# Patient Record
Sex: Male | Born: 2000 | Race: White | Hispanic: Yes | Marital: Single | Smoking: Never smoker
Health system: Southern US, Community
[De-identification: ages and names within clinical notes are randomized; demographics above are authoritative.]

## PROBLEM LIST (undated history)

## (undated) DIAGNOSIS — Z789 Other specified health status: Secondary | ICD-10-CM

---
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
## 2024-02-18 ENCOUNTER — Ambulatory Visit (HOSPITAL_COMMUNITY)
Admission: EM | Admit: 2024-02-18 | Discharge: 2024-02-18 | Disposition: A | Discharge status: Home / Self Care | Payer: Self-pay

## 2024-02-18 ENCOUNTER — Encounter (HOSPITAL_COMMUNITY): Payer: Self-pay

## 2024-02-18 DIAGNOSIS — L03211 Cellulitis of face: Secondary | ICD-10-CM

## 2024-02-18 MED ORDER — NAPROXEN 500 MG PO TABS: 500.0000 mg | ORAL_TABLET | Freq: Two times a day (BID) | ORAL | 0 refills | Status: AC

## 2024-02-18 MED ORDER — AMOXICILLIN-POT CLAVULANATE 875-125 MG PO TABS: 1.0000 | ORAL_TABLET | Freq: Two times a day (BID) | ORAL | 0 refills | Status: DC

## 2024-02-18 NOTE — ED Triage Notes (Signed)
 Patient presenting with mass behind the left ear onset 1 week ago. States this is the first time. Slightly painful now and growing.  Denies any drainage  Prescriptions or OTC medications tried: Yes- tylenol    with little relief

## 2024-02-18 NOTE — ED Provider Notes (Signed)
 [facility]  Note:  This document was prepared using Dragon voice recognition software and may include unintentional dictation errors.  MRN: [id number] DOB: June 04, 2001  Subjective:   Gregg Burns is a 23 y.o. male presenting for swelling to posterior left ear x 1 week.  Patient admits to taking Tylenol  for pain and inflammation with minimal improvement.  Denies any injury or known cause.  No inner ear pain, ear drainage, headache, fever, decreased hearing, or fever.  Patient denies any prior history of abscesses or cysts.  No current facility-administered medications for this encounter.  Current Outpatient Medications:    amoxicillin -clavulanate (AUGMENTIN ) 875-125 MG tablet, Take 1 tablet by mouth every 12 (twelve) hours., Disp: 14 tablet, Rfl: 0   MULTIPLE VITAMINS PO, Take by mouth., Disp: , Rfl:    naproxen  (NAPROSYN ) 500 MG tablet, Take 1 tablet (500 mg total) by mouth 2 (two) times daily., Disp: 30 tablet, Rfl: 0   No Known Allergies  Past Medical History:  Diagnosis Date   Medical history non-contributory      History reviewed. No pertinent surgical history.  History reviewed. No pertinent family history.  Social History   Tobacco Use   Smoking status: Never   Smokeless tobacco: Never  Vaping Use   Vaping status: Never Used  Substance Use Topics   Alcohol use: No   Drug use: No    ROS Refer to HPI for ROS details.  Objective:   Vitals: BP 109/71 (BP Location: Right Arm)   Pulse 74   Temp 98.5 F (36.9 C) (Oral)   Resp 16   Ht 5' 11 (1.803 m)   Wt 180 lb (81.6 kg)   SpO2 96%   BMI 25.10 kg/m   Physical Exam Vitals and nursing note reviewed.  Constitutional:      General: Gregg Burns is not in acute distress.    Appearance: Normal appearance. Gregg Burns is well-developed. Gregg Burns is not ill-appearing or toxic-appearing.  HENT:     Head: Normocephalic and atraumatic.     Right Ear: Tympanic membrane, ear canal and external ear normal. No  swelling or tenderness. No mastoid tenderness. Tympanic membrane is not injected, erythematous or bulging.     Left Ear: Tympanic membrane and ear canal normal. Swelling and tenderness present. No mastoid tenderness. Tympanic membrane is not injected, erythematous or bulging.     Nose: Nose normal. No congestion.     Mouth/Throat:     Mouth: Mucous membranes are moist.     Pharynx: Oropharynx is clear.  Cardiovascular:     Rate and Rhythm: Normal rate.  Pulmonary:     Effort: Pulmonary effort is normal. No respiratory distress.  Skin:    General: Skin is warm and dry.  Neurological:     General: No focal deficit present.     Mental Status: Gregg Burns is alert and oriented to person, place, and time.  Psychiatric:        Mood and Affect: Mood normal.        Behavior: Behavior normal.     Procedures  No results found for this or any previous visit (from the past 24 hours).  No results found.   Assessment and Plan :     Discharge Instructions       1. Preauricular cellulitis (Primary) - amoxicillin -clavulanate (AUGMENTIN ) 875-125 MG tablet; Take 1 tablet by mouth every 12 (twelve) hours.  Dispense: 14 tablet; Refill: 0 - naproxen  (NAPROSYN ) 500 MG tablet; Take 1 tablet (500 mg total) by mouth  2 (two) times daily.  Dispense: 30 tablet; Refill: 0 - Continue to monitor symptoms for any change severity if there is any escalation of current symptoms or development of new symptoms follow-up for further evaluation and management.      Gregg Burns   Gregg Burns, Gregg Burns, Gregg Burns 02/18/24 [phone]

## 2024-02-18 NOTE — Discharge Instructions (Signed)
  1. Preauricular cellulitis (Primary) - amoxicillin -clavulanate (AUGMENTIN ) 875-125 MG tablet; Take 1 tablet by mouth every 12 (twelve) hours.  Dispense: 14 tablet; Refill: 0 - naproxen  (NAPROSYN ) 500 MG tablet; Take 1 tablet (500 mg total) by mouth 2 (two) times daily.  Dispense: 30 tablet; Refill: 0 - Continue to monitor symptoms for any change severity if there is any escalation of current symptoms or development of new symptoms follow-up for further evaluation and management.

## 2024-02-20 ENCOUNTER — Encounter: Payer: Self-pay | Admitting: Internal Medicine

## 2024-02-20 ENCOUNTER — Ambulatory Visit (INDEPENDENT_AMBULATORY_CARE_PROVIDER_SITE_OTHER): Payer: Self-pay | Admitting: Internal Medicine

## 2024-02-20 VITALS — BP 109/75 | HR 60 | Ht 72.0 in | Wt 180.8 lb

## 2024-02-20 DIAGNOSIS — R590 Localized enlarged lymph nodes: Secondary | ICD-10-CM

## 2024-02-20 DIAGNOSIS — L03211 Cellulitis of face: Secondary | ICD-10-CM

## 2024-02-20 DIAGNOSIS — K1121 Acute sialoadenitis: Secondary | ICD-10-CM

## 2024-02-20 NOTE — Progress Notes (Addendum)
 New Patient Office Visit  Subjective:  Patient ID: Gregg Burns, male    DOB: 2000-12-03  Age: 23 y.o. MRN: [id number]  CC:  Chief Complaint  Patient presents with   Ear Pain    Pt reports ear pain and left side of face also some swelling. Had a spider bite 3 weeks ago on right side. Went to [facility] antibiotic was prescribed.     HPI Gregg Burns is a 23 y.o. male with past medical history of MDD who presents for establishing care.  He reports having pain around left ear and bumps in the neck area.  He has noticed hard and tender mass anterior to the left ear for the last 1 week.  He has noticed ear pain and pain over the left side of the face, went to urgent care 2 days ago.  He was given Augmentin  for preauricular cellulitis.  He has also noticed tender bumps in the submandibular area and supraclavicular area.  Has noticed low-grade fever, but denies chills.  He is not sexually active, but has had kissing exposure.  Has mild fatigue, but denies any abdominal pain, nausea or vomiting.  He has had MMR vaccine during childhood.   Past Medical History:  Diagnosis Date   Medical history non-contributory     History reviewed. No pertinent surgical history.  History reviewed. No pertinent family history.  Social History   Socioeconomic History   Marital status: Single    Spouse name: Not on file   Number of children: Not on file   Years of education: Not on file   Highest education level: Not on file  Occupational History   Not on file  Tobacco Use   Smoking status: Never   Smokeless tobacco: Never  Vaping Use   Vaping status: Never Used  Substance and Sexual Activity   Alcohol use: No   Drug use: No   Sexual activity: Never    Birth control/protection: Abstinence  Other Topics Concern   Not on file  Social History Narrative   Not on file   Social Drivers of Health   Financial Resource Strain: Not on file  Food Insecurity: Not on file   Transportation Needs: Not on file  Physical Activity: Not on file  Stress: Not on file  Social Connections: Not on file  Intimate Partner Violence: Not on file    ROS Review of Systems  Constitutional:  Positive for fatigue and fever. Negative for chills.  HENT:  Positive for ear pain (Left). Negative for congestion, ear discharge and sore throat.   Eyes:  Negative for pain and discharge.  Respiratory:  Negative for cough and shortness of breath.   Cardiovascular:  Negative for chest pain and palpitations.  Gastrointestinal:  Negative for diarrhea, nausea and vomiting.  Endocrine: Negative for polydipsia and polyuria.  Genitourinary:  Negative for dysuria and hematuria.  Musculoskeletal:  Negative for neck pain and neck stiffness.  Skin:  Negative for rash.  Neurological:  Negative for dizziness and weakness.  Psychiatric/Behavioral:  Negative for agitation and behavioral problems.     Objective:   Today's Vitals: BP 109/75   Pulse 60   Ht 6' (1.829 m)   Wt 180 lb 12.8 oz (82 kg)   SpO2 94%   BMI 24.52 kg/m   Physical Exam Vitals reviewed.  Constitutional:      General: He is not in acute distress.    Appearance: He is not diaphoretic.  HENT:  Head: Normocephalic and atraumatic.     Right Ear: Ear canal normal.     Left Ear: Ear canal normal.     Nose: Nose normal.     Mouth/Throat:     Mouth: Mucous membranes are moist.  Eyes:     General: No scleral icterus.    Extraocular Movements: Extraocular movements intact.  Neck:   Cardiovascular:     Rate and Rhythm: Normal rate and regular rhythm.     Heart sounds: Normal heart sounds. No murmur heard. Pulmonary:     Breath sounds: Normal breath sounds. No wheezing or rales.  Musculoskeletal:     Cervical back: Neck supple. No tenderness.     Right lower leg: No edema.     Left lower leg: No edema.  Lymphadenopathy:     Cervical: Cervical adenopathy present.     Right cervical: Posterior cervical adenopathy  present.  Skin:    General: Skin is warm.     Findings: No rash.  Neurological:     General: No focal deficit present.     Mental Status: He is alert and oriented to person, place, and time.  Psychiatric:        Mood and Affect: Mood normal.        Behavior: Behavior normal.     Assessment & Plan:   Problem List Items Addressed This Visit       Digestive   Parotitis, acute - Primary   Likely has acute parotitis in addition to cervical LAD Most likely related to viral infection Advised to maintain adequate hydration Advised to perform warm compresses and/or milking of the duct Can complete course of Augmentin  for bacterial prophylaxis If persistent, will need ENT evaluation        Immune and Lymphatic   LAD (lymphadenopathy) of left cervical region   Differential includes viral infection, such as infectious mononucleosis Does not have splenomegaly currently Requires conservative treatment, avoided additional testing due to financial constraints If persistent concern, will get imaging and/or ENT evaluation       Addendum: Due to persistent swelling, referred to ENT.  Outpatient Encounter Medications as of 02/20/2024  Medication Sig   amoxicillin -clavulanate (AUGMENTIN ) 875-125 MG tablet Take 1 tablet by mouth every 12 (twelve) hours.   MULTIPLE VITAMINS PO Take by mouth.   naproxen  (NAPROSYN ) 500 MG tablet Take 1 tablet (500 mg total) by mouth 2 (two) times daily.   [DISCONTINUED] cetirizine  (ZYRTEC ) 10 MG tablet Take 1 tablet (10 mg total) by mouth daily. (Patient not taking: Reported on 04/21/2016)   [DISCONTINUED] FLUoxetine  (PROZAC ) 20 MG capsule Take 1 capsule (20 mg total) by mouth daily. (Patient not taking: Reported on 09/22/2017)   No facility-administered encounter medications on file as of 02/20/2024.    Follow-up: Return if symptoms worsen or fail to improve.   Suzzane MARLA Blanch, MD

## 2024-02-20 NOTE — Patient Instructions (Addendum)
 Please complete course of Augmentin  as prescribed.  Please perform warm compresses as discussed.

## 2024-02-20 NOTE — Assessment & Plan Note (Addendum)
 Likely has acute parotitis in addition to cervical LAD Most likely related to viral infection Advised to maintain adequate hydration Advised to perform warm compresses and/or milking of the duct Can complete course of Augmentin  for bacterial prophylaxis If persistent, will need ENT evaluation

## 2024-02-20 NOTE — Assessment & Plan Note (Signed)
 Differential includes viral infection, such as infectious mononucleosis Does not have splenomegaly currently Requires conservative treatment, avoided additional testing due to financial constraints If persistent concern, will get imaging and/or ENT evaluation

## 2024-03-07 MED ORDER — AMOXICILLIN-POT CLAVULANATE 875-125 MG PO TABS: 1.0000 | ORAL_TABLET | Freq: Two times a day (BID) | ORAL | 0 refills | Status: DC

## 2024-03-07 NOTE — Addendum Note (Signed)
 Addended byBETHA TOBIE DOWNS on: 03/07/2024 11:55 AM   Modules accepted: Orders

## 2024-03-25 ENCOUNTER — Ambulatory Visit (INDEPENDENT_AMBULATORY_CARE_PROVIDER_SITE_OTHER): Payer: Self-pay | Admitting: Otolaryngology

## 2024-03-25 ENCOUNTER — Encounter (INDEPENDENT_AMBULATORY_CARE_PROVIDER_SITE_OTHER): Payer: Self-pay | Admitting: Otolaryngology

## 2024-03-25 VITALS — BP 129/75 | HR 71 | Ht 72.0 in | Wt 180.0 lb

## 2024-03-25 DIAGNOSIS — K118 Other diseases of salivary glands: Secondary | ICD-10-CM

## 2024-03-25 DIAGNOSIS — K1120 Sialoadenitis, unspecified: Secondary | ICD-10-CM

## 2024-03-25 MED ORDER — AMOXICILLIN-POT CLAVULANATE 875-125 MG PO TABS: 1.0000 | ORAL_TABLET | Freq: Two times a day (BID) | ORAL | 0 refills | Status: AC

## 2024-03-25 NOTE — Progress Notes (Signed)
 Dear Dr. Tobie, Here is my assessment for our mutual patient, Gregg Burns. Thank you for allowing me the opportunity to care for your patient. Please do not hesitate to contact me should you have any other questions. Sincerely, Dr. Eldora Tobie  Otolaryngology Clinic Note Referring provider: Dr. Tobie HPI:  Gregg Burns is a 23 y.o. male kindly referred by Dr. Tobie for evaluation of left preauricular swelling  Initial visit (03/2024): Patient reports that about a month ago, patient started to have some left parotid area swelling. It was painful, and the swelling progressed to involved his cheek and went all the way up to his eye. He went to UC, was prescribed Augmentin  and did parotid massage which helped the symptoms. However, there continues to be a small mass postauricular/parotid tail area which persists. No pain, stays about the same. He has not tried anything else except facial massage which he continues to do. Up to date on vaccinations. He reports that the only other exposure was a spider bite on the RIGHT cheek about a month prior. No submandibular gland swelling or neck masses. No facial weakness or numbness. No testicular swelling or orchitis sx. No joint pain, rashes. No jaw pain, no dental pain. Does not see dentist regularly. No cat scratches but does live with cats Patient otherwise denies: - dysphagia, odynophagia, unintentional weight loss - ear pain  H&N Surgery: no Personal or FHx of bleeding dz or anesthesia difficulty: no  AP/AC: no  Tobacco: no  PMHx: Healthy, prior MDD  Independent Review of Additional Tests or Records:  Dr. JONELLE Tobie (03/2024): pain around left ear, with hard and tender mass anterior to left ear for 1 week; given augmentin  for preauricular cellulitis; Dx: Acute parotitis possibly viral; LAD; Rx: ref to ENT UC 02/18/2024 [name] - posterior left ear swelling, no fever, decreased hearing. Dx: preauricular  celllulitis; Rx: augmentin   PMH/Meds/All/SocHx/FamHx/ROS:   Past Medical History:  Diagnosis Date   Medical history non-contributory      History reviewed. No pertinent surgical history.  History reviewed. No pertinent family history.   Social Connections: Not on file      Current Outpatient Medications:    MULTIPLE VITAMINS PO, Take by mouth., Disp: , Rfl:    naproxen  (NAPROSYN ) 500 MG tablet, Take 1 tablet (500 mg total) by mouth 2 (two) times daily., Disp: 30 tablet, Rfl: 0   amoxicillin -clavulanate (AUGMENTIN ) 875-125 MG tablet, Take 1 tablet by mouth every 12 (twelve) hours., Disp: 14 tablet, Rfl: 0   Physical Exam:   BP 129/75 (BP Location: Right Arm, Patient Position: Sitting, Cuff Size: Normal)   Pulse 71   Ht 6' (1.829 m)   Wt 180 lb (81.6 kg)   SpO2 98%   BMI 24.41 kg/m   Salient findings:  CN II-XII intact - facial nerve function intact Bilateral EAC clear and TM intact with well pneumatized middle ear spaces Anterior rhinoscopy: Septum intact; bilateral inferior turbinates without significant hypertrophy No lesions of oral cavity/oropharynx; dentition fair; easily expressible saliva b/l parotid ducts No obviously palpable neck masses/lymphadenopathy/thyromegaly EXCEPT - left parotid nodule(?) v/s LN just anterior to the tragus as well as inferiorly/over parotid tail; not tender, no overlying skin changes; relatively soft and mobile No respiratory distress or stridor  Seprately Identifiable Procedures:  Prior to initiating any procedures, risks/benefits/alternatives were explained to the patient and verbal consent obtained. None  Impression & Plans:  Gregg Burns is a 23 y.o. male with  1. Parotid mass  2. Parotitis    We discussed reactive, benign and malignant etiologies; given sx, suspect intraparotid LN but given persistence, joint decision made to treat with another round of abx and get post-treatment CT  - Augmentin  BID x10d - Warm  compress  - Massage the area - f/u in 6 weeks with CT  See below regarding exact medications prescribed this encounter including dosages and route: Meds ordered this encounter  Medications   amoxicillin -clavulanate (AUGMENTIN ) 875-125 MG tablet    Sig: Take 1 tablet by mouth every 12 (twelve) hours.    Dispense:  14 tablet    Refill:  0      Thank you for allowing me the opportunity to care for your patient. Please do not hesitate to contact me should you have any other questions.  Sincerely, Eldora Blanch, MD Otolaryngologist (ENT), [facility] Phone: [phone] Fax: [phone]  03/25/2024, 9:26 AM   MDM:  Level 4 - [phone] Complexity/Problems addressed: mod Data complexity: mod - independent review of notes (multiple), and ordering test - Morbidity: mod  - Prescription Drug prescribed or managed: y

## 2024-03-25 NOTE — Patient Instructions (Signed)
 Take Augmentin  875 mg by mouth (PO) twice daily for 10 days; take with food, take probiotic or yogurt with it I have ordered an imaging study for you to complete prior to your next visit. Please call [facility] Scheduling at [phone] to schedule your imaging if you have not received a call within 24 hours. If you are unable to complete your imaging study prior to your next scheduled visit please call our office to let us  know.

## 2024-04-09 ENCOUNTER — Telehealth (INDEPENDENT_AMBULATORY_CARE_PROVIDER_SITE_OTHER): Payer: Self-pay | Admitting: Otolaryngology

## 2024-04-09 NOTE — Telephone Encounter (Signed)
 LVM for patient to call back to reschedule 10/06 appointment - Dr. Tobie in surgery

## 2024-04-29 ENCOUNTER — Ambulatory Visit (HOSPITAL_BASED_OUTPATIENT_CLINIC_OR_DEPARTMENT_OTHER)
Admission: RE | Admit: 2024-04-29 | Discharge: 2024-04-29 | Disposition: A | Discharge status: Home / Self Care | Payer: Self-pay | Source: Ambulatory Visit | Attending: Otolaryngology | Admitting: Otolaryngology

## 2024-04-29 DIAGNOSIS — K118 Other diseases of salivary glands: Secondary | ICD-10-CM

## 2024-04-29 MED ORDER — IOHEXOL 300 MG/ML  SOLN
100.0000 mL | Freq: Once | INTRAMUSCULAR | Status: AC | PRN
Start: 2024-04-29 — End: 2024-04-29
  Administered 2024-04-29: 76 mL via INTRAVENOUS

## 2024-05-06 ENCOUNTER — Ambulatory Visit (INDEPENDENT_AMBULATORY_CARE_PROVIDER_SITE_OTHER): Payer: Self-pay | Admitting: Otolaryngology

## 2024-05-06 ENCOUNTER — Encounter (INDEPENDENT_AMBULATORY_CARE_PROVIDER_SITE_OTHER): Payer: Self-pay | Admitting: Otolaryngology

## 2024-05-06 VITALS — BP 109/63 | HR 68 | Ht 72.0 in | Wt 180.0 lb

## 2024-05-06 DIAGNOSIS — K118 Other diseases of salivary glands: Secondary | ICD-10-CM

## 2024-05-06 NOTE — Patient Instructions (Addendum)
 I have ordered an imaging study for you to complete prior to your next visit. Please call [facility] Scheduling at [phone] to schedule your imaging if you have not received a call within 24 hours. If you are unable to complete your imaging study prior to your next scheduled visit please call our office to let us  know.    [email or web address]

## 2024-05-06 NOTE — Progress Notes (Signed)
 Dear Dr. Tobie, Here is my assessment for our mutual patient, Gregg Burns. Thank you for allowing me the opportunity to care for your patient. Please do not hesitate to contact me should you have any other questions. Sincerely, Dr. Eldora Tobie  Otolaryngology Clinic Note Referring provider: Dr. Tobie HPI:  Gregg Burns is a 23 y.o. male kindly referred by Dr. Tobie for evaluation of left preauricular swelling  Initial visit (03/2024): Patient reports that about a month ago, patient started to have some left parotid area swelling. It was painful, and the swelling progressed to involved his cheek and went all the way up to his eye. He went to UC, was prescribed Augmentin  and did parotid massage which helped the symptoms. However, there continues to be a small mass postauricular/parotid tail area which persists. No pain, stays about the same. He has not tried anything else except facial massage which he continues to do. Up to date on vaccinations. He reports that the only other exposure was a spider bite on the RIGHT cheek about a month prior. No submandibular gland swelling or neck masses. No facial weakness or numbness. No testicular swelling or orchitis sx. No joint pain, rashes. No jaw pain, no dental pain. Does not see dentist regularly. No cat scratches but does live with cats Patient otherwise denies: - dysphagia, odynophagia, unintentional weight loss - ear pain  --------------------------------------------------------- 05/06/2024 Vaccinated. Mild discomfort with significant pressure. No other lumps or bumps in face he has noted. We discussed his CT. No animal exposures otherwise. No facial weakness. No change after abx.  H&N Surgery: no Personal or FHx of bleeding dz or anesthesia difficulty: no  AP/AC: no  Tobacco: no  PMHx: Healthy, prior MDD  Independent Review of Additional Tests or Records:  Dr. JONELLE Tobie (03/2024): pain around left ear, with  hard and tender mass anterior to left ear for 1 week; given augmentin  for preauricular cellulitis; Dx: Acute parotitis possibly viral; LAD; Rx: ref to ENT UC 02/18/2024 [name] - posterior left ear swelling, no fever, decreased hearing. Dx: preauricular celllulitis; Rx: augmentin  CT Neck 04/29/2024 independently interpreted: multiple left masses over parotid gland, no noted obvious lesions on right. Also with some bilateral but L > R smaller lymph nodes. No obvious enhancing lesions noted PMH/Meds/All/SocHx/FamHx/ROS:   Past Medical History:  Diagnosis Date   Medical history non-contributory      History reviewed. No pertinent surgical history.  History reviewed. No pertinent family history.   Social Connections: Not on file      Current Outpatient Medications:    amoxicillin -clavulanate (AUGMENTIN ) 875-125 MG tablet, Take 1 tablet by mouth every 12 (twelve) hours., Disp: 14 tablet, Rfl: 0   MULTIPLE VITAMINS PO, Take by mouth., Disp: , Rfl:    naproxen  (NAPROSYN ) 500 MG tablet, Take 1 tablet (500 mg total) by mouth 2 (two) times daily., Disp: 30 tablet, Rfl: 0   Physical Exam:   BP 109/63 (BP Location: Right Arm, Patient Position: Sitting, Cuff Size: Large)   Pulse 68   Ht 6' (1.829 m)   Wt 180 lb (81.6 kg)   SpO2 95%   BMI 24.41 kg/m   Salient findings:  CN II-XII intact - facial nerve function intact Bilateral EAC clear and TM intact with well pneumatized middle ear spaces Anterior rhinoscopy: Septum intact; bilateral inferior turbinates without significant hypertrophy No lesions of oral cavity/oropharynx; dentition fair; easily expressible saliva b/l parotid ducts No obviously palpable neck masses/lymphadenopathy/thyromegaly EXCEPT - left parotid nodule(?) v/s  LN just anterior to the tragus as well as inferiorly/over parotid tail; not tender, no overlying skin changes; relatively soft and mobile No respiratory distress or stridor  Seprately Identifiable Procedures:   Prior to initiating any procedures, risks/benefits/alternatives were explained to the patient and verbal consent obtained. None  Impression & Plans:  Gregg Burns is a 23 y.o. male with  1. 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 We also discussed HIV but since this is unilateral deferred testing.  - FNA - f/u in 6 weeks by phone --- given info for financial assistance  See below regarding exact medications prescribed this encounter including dosages and route: No orders of the defined types were placed in this encounter.     Thank you for allowing me the opportunity to care for your patient. Please do not hesitate to contact me should you have any other questions.  Sincerely, Eldora Blanch, MD Otolaryngologist (ENT), [facility] Phone: [phone] Fax: [phone]  05/06/2024, 3:06 PM   MDM:  [phone] Complexity/Problems addressed: low Data complexity: mod - independent CT interpret - Morbidity: low - Prescription Drug prescribed or managed n

## 2024-05-07 ENCOUNTER — Encounter (HOSPITAL_COMMUNITY): Payer: Self-pay

## 2024-05-07 NOTE — Progress Notes (Signed)
 [name], MD  [name] PROCEDURE / BIOPSY REVIEW Date: 05/07/24  Requested Biopsy site: Left parotid mass and lymph node Reason for request: needs diagnosis Imaging review: CT neck  Decision: Approved Imaging modality to perform: Ultrasound Schedule with: ask patient if they want sedation. Schedule for: Any VIR  Additional comments: US  FNA of left parotid mass ordered.  Discussed with ENT about core biopsy to rule out lymphoma.  Would evaluate lymph nodes and left parotid with US .  If they all look like lymph nodes, core biopsy most accessible.  May also do FNA of largest parotid lesion.  Please contact me with questions, concerns, or if issue pertaining to this request arise.  [name] Philip, MD [facility]       Previous Messages    ----- Message ----- From: Gregg Burns Sent: 05/06/2024   3:08 PM EDT To: Gregg Burns; Ir Procedure Requests Subject: US  FNA BIOPSY SALIVARY GLAND PAROTID GLAND E*  Procedure : US  FNA BIOPSY SALIVARY GLAND PAROTID GLAND EA ADDT'L LESION  Reason : left parotid masses, request FNA Dx: Parotid mass [K11.8 (ICD-10-CM)]  Ordering Comments  left parotid masses, request FNA    History : CT soft tissue neck w/  Provider : Tobie Eldora [name], MD  Contact : [phone]

## 2024-06-04 ENCOUNTER — Ambulatory Visit (HOSPITAL_COMMUNITY)
Admission: RE | Admit: 2024-06-04 | Discharge: 2024-06-04 | Disposition: A | Discharge status: Home / Self Care | Payer: Self-pay | Source: Ambulatory Visit | Attending: Otolaryngology | Admitting: Otolaryngology

## 2024-06-04 DIAGNOSIS — K118 Other diseases of salivary glands: Secondary | ICD-10-CM | POA: Insufficient documentation

## 2024-06-04 MED ORDER — LIDOCAINE-EPINEPHRINE (PF) 2 %-1:200000 IJ SOLN
INTRAMUSCULAR | Status: AC
  Filled 2024-06-04: qty 20

## 2024-06-05 LAB — CYTOLOGY - NON PAP

## 2024-06-20 ENCOUNTER — Ambulatory Visit (INDEPENDENT_AMBULATORY_CARE_PROVIDER_SITE_OTHER): Payer: Self-pay | Admitting: Otolaryngology

## 2024-06-20 NOTE — Progress Notes (Signed)
 Dear Dr. Tobie, Here is my assessment for our mutual patient, Gregg Burns. Thank you for allowing me the opportunity to care for your patient. Please do not hesitate to contact me should you have any other questions. Sincerely, Dr. Eldora Tobie  Otolaryngology Clinic Note Referring provider: Dr. Tobie HPI:  Gregg Burns is a 23 y.o. male kindly referred by Dr. Tobie for evaluation of left preauricular swelling  Initial visit (03/2024): Patient reports that about a month ago, patient started to have some left parotid area swelling. It was painful, and the swelling progressed to involved his cheek and went all the way up to his eye. He went to UC, was prescribed Augmentin  and did parotid massage which helped the symptoms. However, there continues to be a small mass postauricular/parotid tail area which persists. No pain, stays about the same. He has not tried anything else except facial massage which he continues to do. Up to date on vaccinations. He reports that the only other exposure was a spider bite on the RIGHT cheek about a month prior. No submandibular gland swelling or neck masses. No facial weakness or numbness. No testicular swelling or orchitis sx. No joint pain, rashes. No jaw pain, no dental pain. Does not see dentist regularly. No cat scratches but does live with cats Patient otherwise denies: - dysphagia, odynophagia, unintentional weight loss - ear pain  --------------------------------------------------------- 05/06/2024 Vaccinated. Mild discomfort with significant pressure. No other lumps or bumps in face he has noted. We discussed his CT. No animal exposures otherwise. No facial weakness. No change after abx. --------------------------------------------------------- 06/20/2024 The patient gave consent to have this visit done by telemedicine / virtual visit, two identifiers were used to identify patient. This is also consent for access the chart  and treat the patient via this visit. The patient is located in [place] .  I, the provider, am at the office.  We spent 10 minutes together for the visit.  Joined by telephone  Seen in follow up. Still noted persistent area where there is some swelling. No pain, no facial weakness. No other neck LAD. We discussed Bx results. No B symptoms   H&N Surgery: no Personal or FHx of bleeding dz or anesthesia difficulty: no  AP/AC: no  Tobacco: no  PMHx: Healthy, prior MDD  Independent Review of Additional Tests or Records:  Dr. JONELLE Tobie (03/2024): pain around left ear, with hard and tender mass anterior to left ear for 1 week; given augmentin  for preauricular cellulitis; Dx: Acute parotitis possibly viral; LAD; Rx: ref to ENT UC 02/18/2024 Gregg Burns - posterior left ear swelling, no fever, decreased hearing. Dx: preauricular celllulitis; Rx: augmentin  CT Neck 04/29/2024 independently interpreted: multiple left masses over parotid gland, no noted obvious lesions on right. Also with some bilateral but L > R smaller lymph nodes. No obvious enhancing lesions noted FNA 06/04/2024: noted mixed lymphoid population - intraparotid node possible PMH/Meds/All/SocHx/FamHx/ROS:   Past Medical History:  Diagnosis Date   Medical history non-contributory      No past surgical history on file.  No family history on file.   Social Connections: Not on file      Current Outpatient Medications:    amoxicillin -clavulanate (AUGMENTIN ) 875-125 MG tablet, Take 1 tablet by mouth every 12 (twelve) hours., Disp: 14 tablet, Rfl: 0   MULTIPLE VITAMINS PO, Take by mouth., Disp: , Rfl:    naproxen  (NAPROSYN ) 500 MG tablet, Take 1 tablet (500 mg total) by mouth 2 (two) times daily., Disp: 30  tablet, Rfl: 0   Physical Exam:   There were no vitals taken for this visit.  Salient findings (not done today as it was a phone visit):  CN II-XII intact - facial nerve function intact Bilateral EAC clear and TM  intact with well pneumatized middle ear spaces Anterior rhinoscopy: Septum intact; bilateral inferior turbinates without significant hypertrophy No lesions of oral cavity/oropharynx; dentition fair; easily expressible saliva b/l parotid ducts No obviously palpable neck masses/lymphadenopathy/thyromegaly EXCEPT - left parotid nodule(?) v/s LN just anterior to the tragus as well as inferiorly/over parotid tail; not tender, no overlying skin changes; relatively soft and mobile No respiratory distress or stridor  Seprately Identifiable Procedures:  Prior to initiating any procedures, risks/benefits/alternatives were explained to the patient and verbal consent obtained. None  Impression & Plans:  Gregg Burns is a 23 y.o. male with  1. Parotid mass   2. 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  Discussed options and at this point given persistence, can consider further lab workup v/s excision. However, after discussion, patient would like to observe this despite risks. Discussed return precautions as well.   Would rec f/u in 3-6 months with CT   See below regarding exact medications prescribed this encounter including dosages and route: No orders of the defined types were placed in this encounter.     Thank you for allowing me the opportunity to care for your patient. Please do not hesitate to contact me should you have any other questions.  Sincerely, Eldora Blanch, MD Otolaryngologist (ENT), [facility] Phone: [phone] Fax: [phone]  07/14/2024, 10:21 AM   MDM: low

## 2024-07-14 ENCOUNTER — Encounter (INDEPENDENT_AMBULATORY_CARE_PROVIDER_SITE_OTHER): Payer: Self-pay | Admitting: Otolaryngology
